# Patient Record
Sex: Female | Born: 1955 | Race: White | Hispanic: No | Marital: Married | Smoking: Former smoker
Health system: Southern US, Community
[De-identification: ages and names within clinical notes are randomized; demographics above are authoritative.]

## PROBLEM LIST (undated history)

## (undated) ENCOUNTER — Ambulatory Visit: Discharge status: Absent without leave | Payer: BC Managed Care – PPO

## (undated) DIAGNOSIS — I10 Essential (primary) hypertension: Secondary | ICD-10-CM

## (undated) DIAGNOSIS — K8020 Calculus of gallbladder without cholecystitis without obstruction: Secondary | ICD-10-CM

## (undated) HISTORY — PX: ABDOMINAL HYSTERECTOMY: SHX81

---

## 1997-08-22 ENCOUNTER — Other Ambulatory Visit: Admission: RE | Admit: 1997-08-22 | Discharge: 1997-08-22 | Payer: Self-pay | Admitting: Obstetrics and Gynecology

## 1997-09-06 ENCOUNTER — Other Ambulatory Visit: Admission: RE | Admit: 1997-09-06 | Discharge: 1997-09-06 | Payer: Self-pay | Admitting: Obstetrics and Gynecology

## 1997-11-05 ENCOUNTER — Ambulatory Visit (HOSPITAL_COMMUNITY): Admission: RE | Admit: 1997-11-05 | Discharge: 1997-11-05 | Payer: Self-pay | Admitting: Obstetrics and Gynecology

## 1997-11-05 ENCOUNTER — Encounter: Payer: Self-pay | Admitting: Obstetrics and Gynecology

## 1997-11-14 ENCOUNTER — Encounter: Admission: RE | Admit: 1997-11-14 | Discharge: 1997-11-14 | Payer: Self-pay | Admitting: *Deleted

## 1998-09-30 ENCOUNTER — Other Ambulatory Visit: Admission: RE | Admit: 1998-09-30 | Discharge: 1998-09-30 | Payer: Self-pay | Admitting: Obstetrics and Gynecology

## 2000-02-12 ENCOUNTER — Ambulatory Visit (HOSPITAL_COMMUNITY): Admission: RE | Admit: 2000-02-12 | Discharge: 2000-02-12 | Payer: Self-pay | Admitting: Obstetrics and Gynecology

## 2000-03-19 ENCOUNTER — Encounter: Payer: Self-pay | Admitting: Obstetrics and Gynecology

## 2000-03-24 ENCOUNTER — Inpatient Hospital Stay (HOSPITAL_COMMUNITY): Admission: RE | Admit: 2000-03-24 | Discharge: 2000-03-25 | Payer: Self-pay | Admitting: Obstetrics and Gynecology

## 2000-05-23 ENCOUNTER — Encounter: Payer: Self-pay | Admitting: Emergency Medicine

## 2000-05-23 ENCOUNTER — Emergency Department (HOSPITAL_COMMUNITY): Admission: EM | Admit: 2000-05-23 | Discharge: 2000-05-23 | Payer: Self-pay | Admitting: Emergency Medicine

## 2000-10-27 ENCOUNTER — Other Ambulatory Visit: Admission: RE | Admit: 2000-10-27 | Discharge: 2000-10-27 | Payer: Self-pay | Admitting: Obstetrics and Gynecology

## 2001-12-06 ENCOUNTER — Other Ambulatory Visit: Admission: RE | Admit: 2001-12-06 | Discharge: 2001-12-06 | Payer: Self-pay | Admitting: Obstetrics and Gynecology

## 2005-01-05 ENCOUNTER — Emergency Department (HOSPITAL_COMMUNITY): Admission: EM | Admit: 2005-01-05 | Discharge: 2005-01-06 | Payer: Self-pay | Admitting: *Deleted
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
## 2010-04-06 ENCOUNTER — Encounter: Payer: Self-pay | Admitting: Family Medicine

## 2010-04-09 ENCOUNTER — Encounter
Admission: RE | Admit: 2010-04-09 | Discharge: 2010-04-09 | Payer: Self-pay | Source: Home / Self Care | Attending: Family Medicine | Admitting: Family Medicine

## 2010-04-15 ENCOUNTER — Encounter
Admission: RE | Admit: 2010-04-15 | Discharge: 2010-04-15 | Payer: Self-pay | Source: Home / Self Care | Attending: Family Medicine | Admitting: Family Medicine

## 2010-06-26 ENCOUNTER — Other Ambulatory Visit: Payer: Self-pay | Admitting: Family Medicine

## 2010-06-26 DIAGNOSIS — R110 Nausea: Secondary | ICD-10-CM

## 2010-06-27 ENCOUNTER — Ambulatory Visit
Admission: RE | Admit: 2010-06-27 | Discharge: 2010-06-27 | Disposition: A | Discharge status: Home / Self Care | Payer: BC Managed Care – PPO | Source: Ambulatory Visit | Attending: Family Medicine | Admitting: Family Medicine

## 2010-06-27 DIAGNOSIS — R110 Nausea: Secondary | ICD-10-CM

## 2010-07-04 ENCOUNTER — Other Ambulatory Visit: Payer: Self-pay | Admitting: Family Medicine

## 2010-07-04 DIAGNOSIS — D1803 Hemangioma of intra-abdominal structures: Secondary | ICD-10-CM

## 2010-07-04 DIAGNOSIS — R1011 Right upper quadrant pain: Secondary | ICD-10-CM

## 2011-01-14 ENCOUNTER — Ambulatory Visit
Admission: RE | Admit: 2011-01-14 | Discharge: 2011-01-14 | Disposition: A | Discharge status: Home / Self Care | Payer: BC Managed Care – PPO | Source: Ambulatory Visit | Attending: Family Medicine | Admitting: Family Medicine

## 2011-01-14 DIAGNOSIS — R1011 Right upper quadrant pain: Secondary | ICD-10-CM

## 2011-01-14 DIAGNOSIS — D1803 Hemangioma of intra-abdominal structures: Secondary | ICD-10-CM

## 2011-04-11 ENCOUNTER — Other Ambulatory Visit: Payer: Self-pay

## 2011-04-11 ENCOUNTER — Emergency Department (HOSPITAL_BASED_OUTPATIENT_CLINIC_OR_DEPARTMENT_OTHER)
Admission: EM | Admit: 2011-04-11 | Discharge: 2011-04-11 | Disposition: A | Discharge status: Home / Self Care | Payer: BC Managed Care – PPO | Attending: Emergency Medicine | Admitting: Emergency Medicine

## 2011-04-11 ENCOUNTER — Encounter (HOSPITAL_BASED_OUTPATIENT_CLINIC_OR_DEPARTMENT_OTHER): Payer: Self-pay | Admitting: *Deleted

## 2011-04-11 DIAGNOSIS — Z79899 Other long term (current) drug therapy: Secondary | ICD-10-CM | POA: Insufficient documentation

## 2011-04-11 DIAGNOSIS — K8050 Calculus of bile duct without cholangitis or cholecystitis without obstruction: Secondary | ICD-10-CM

## 2011-04-11 DIAGNOSIS — R1013 Epigastric pain: Secondary | ICD-10-CM | POA: Insufficient documentation

## 2011-04-11 DIAGNOSIS — I10 Essential (primary) hypertension: Secondary | ICD-10-CM | POA: Insufficient documentation

## 2011-04-11 DIAGNOSIS — R112 Nausea with vomiting, unspecified: Secondary | ICD-10-CM | POA: Insufficient documentation

## 2011-04-11 DIAGNOSIS — K8020 Calculus of gallbladder without cholecystitis without obstruction: Secondary | ICD-10-CM | POA: Insufficient documentation

## 2011-04-11 HISTORY — DX: Essential (primary) hypertension: I10

## 2011-04-11 HISTORY — DX: Calculus of gallbladder without cholecystitis without obstruction: K80.20

## 2011-04-11 LAB — BASIC METABOLIC PANEL
Calcium: 9.9 mg/dL (ref 8.4–10.5)
GFR calc Af Amer: >90 mL/min (ref >90)
GFR calc non Af Amer: 81 mL/min — ABNORMAL LOW (ref >90)
Sodium: 138 mEq/L (ref 135–145)

## 2011-04-11 LAB — DIFFERENTIAL
Eosinophils Absolute: 0 10*3/uL (ref 0.0–0.7)
Lymphs Abs: 1.3 10*3/uL (ref 0.7–4.0)
Neutro Abs: 9.7 10*3/uL — ABNORMAL HIGH (ref 1.7–7.7)
Neutrophils Relative %: 82 % — ABNORMAL HIGH (ref 43–77)

## 2011-04-11 LAB — URINE MICROSCOPIC-ADD ON

## 2011-04-11 LAB — URINALYSIS, ROUTINE W REFLEX MICROSCOPIC
Ketones, ur: 15 mg/dL — AB
Nitrite: NEGATIVE
Protein, ur: NEGATIVE mg/dL
pH: 7 (ref 5.0–8.0)

## 2011-04-11 LAB — CBC
MCH: 32.8 pg (ref 26.0–34.0)
Platelets: 250 10*3/uL (ref 150–400)
RBC: 4.61 MIL/uL (ref 3.87–5.11)
WBC: 11.9 10*3/uL — ABNORMAL HIGH (ref 4.0–10.5)

## 2011-04-11 LAB — HEPATIC FUNCTION PANEL
AST: 19 U/L (ref 0–37)
Bilirubin, Direct: 0.1 mg/dL (ref 0.0–0.3)
Indirect Bilirubin: 0.3 mg/dL (ref 0.3–0.9)

## 2011-04-11 LAB — LIPASE, BLOOD: Lipase: 33 U/L (ref 11–59)

## 2011-04-11 MED ORDER — ONDANSETRON HCL 4 MG/2ML IJ SOLN
4.0000 mg | Freq: Once | INTRAMUSCULAR | Status: AC
  Administered 2011-04-11: 4 mg via INTRAVENOUS
  Filled 2011-04-11: qty 2

## 2011-04-11 MED ORDER — POTASSIUM CHLORIDE 20 MEQ/15ML (10%) PO LIQD
40.0000 meq | Freq: Once | ORAL | Status: AC
  Administered 2011-04-11: 40 meq via ORAL
  Filled 2011-04-11: qty 30

## 2011-04-11 MED ORDER — FENTANYL CITRATE 0.05 MG/ML IJ SOLN
50.0000 ug | Freq: Once | INTRAMUSCULAR | Status: AC
  Administered 2011-04-11: 50 ug via INTRAVENOUS
  Filled 2011-04-11: qty 2

## 2011-04-11 NOTE — ED Provider Notes (Signed)
History   This chart was scribed for Forbes Cellar, MD by Charolett Bumpers . The patient was seen in room MH02/MH02 and the patient's care was started at 9:18pm.  CSN: [id number]  Arrival date & time 04/11/11  1823   First MD Initiated Contact with Patient 04/11/11 2029      Chief Complaint  Patient presents with  . Abdominal Pain    (Consider location/radiation/quality/duration/timing/severity/associated sxs/prior treatment) HPI Pamela Waters is a 56 y.o. female who presents to the Emergency Department complaining of constant, moderate to severe abdominal pain that is focused in the center. Patient reports associated nausea and vomiting. Patient also notes chest pressure when the abdominal pain is at it's worse. Patient reports a h/o a gall bladder attack that occurred April of last year. Patient reports that the pain she is experiencing today is similar. Patient states that the pain a 9/10 and started after eating dinner today. Patient reports no relief with Tramadol. Patient denies fever, chills, diarrhea, constipation, chest pain, SOB, acid reflux, dysuria, hematuria, and blood in stool. Patient states that her abdominal pain is similar to her previous gall bladder attack, and is only relieved by the pain medication given at the ED visit.   Past Medical History  Diagnosis Date  . Gallstones   . Hypertension     Past Surgical History  Procedure Date  . Abdominal hysterectomy     No family history on file.  History  Substance Use Topics  . Smoking status: Former Games developer  . Smokeless tobacco: Never Used  . Alcohol Use: No    OB History    Grav Para Term Preterm Abortions TAB SAB Ect Mult Living                  Review of Systems A complete 10 system review of systems was obtained and is otherwise negative except as noted in the HPI and PMH.   Allergies  Review of patient's allergies indicates no known allergies.  Home Medications   Current Outpatient Rx   Name Route Sig Dispense Refill  . ASPIRIN 81 MG PO TABS Oral Take 81 mg by mouth daily.    . IBUPROFEN 200 MG PO TABS Oral Take 400 mg by mouth every 6 (six) hours as needed. For pain    . LISINOPRIL-HYDROCHLOROTHIAZIDE 20-25 MG PO TABS Oral Take 1 tablet by mouth daily.    Marland Kitchen METOPROLOL TARTRATE 25 MG PO TABS Oral Take 25 mg by mouth 2 (two) times daily.    . ADULT MULTIVITAMIN W/MINERALS CH Oral Take 1 tablet by mouth daily.    Marland Kitchen FISH OIL PO Oral Take 1 capsule by mouth daily.    . TRAMADOL HCL 50 MG PO TABS Oral Take 50 mg by mouth every 6 (six) hours as needed. For pain      BP 146/78  Pulse 56  Temp(Src) 97.6 F (36.4 C) (Oral)  Resp 18  Ht 5\' 9"  (1.753 m)  Wt 230 lb (104.327 kg)  BMI 33.96 kg/m2  SpO2 100%  Physical Exam  Nursing note and vitals reviewed. Constitutional: She is oriented to person, place, and time. She appears well-developed and well-nourished. No distress.  HENT:  Head: Normocephalic and atraumatic.  Mouth/Throat: Oropharynx is clear and moist.  Eyes: EOM are normal. Pupils are equal, round, and reactive to light.  Neck: Normal range of motion. Neck supple. No tracheal deviation present.  Cardiovascular: Normal rate, regular rhythm and normal heart sounds.  Exam reveals no  gallop and no friction rub.   No murmur heard. Pulmonary/Chest: Effort normal and breath sounds normal. No respiratory distress. She has no wheezes. She has no rales. She exhibits no tenderness.  Abdominal: Soft. Bowel sounds are normal. She exhibits no distension and no mass. There is no tenderness. There is no rebound and no guarding.       No RUQ tenderness, No Murphy's sign.   Musculoskeletal: Normal range of motion. She exhibits no edema.  Neurological: She is alert and oriented to person, place, and time. No sensory deficit.  Skin: Skin is warm and dry.  Psychiatric: She has a normal mood and affect. Her behavior is normal.    Date: 04/11/2011  Rate: 67  Rhythm: normal sinus  rhythm with sinus arrythmia  QRS Axis: normal  Intervals: QT prolonged  ST/T Wave abnormalities: nonspecific T wave changes  Conduction Disutrbances:none  Narrative Interpretation:   Old EKG Reviewed: no significant change    ED Course  Procedures (including critical care time)  DIAGNOSTIC STUDIES: Oxygen Saturation is 100% on room air, normal by my interpretation.    COORDINATION OF CARE:   2030: Medication Orders: Ondansetron, Fetanyl 2130: Medication Orders: Potassium Chloride   Labs Reviewed  BASIC METABOLIC PANEL - Abnormal; Notable for the following:    Potassium 3.1 (*)    Glucose, Bld 128 (*)    BUN 25 (*)    GFR calc non Af Amer 81 (*)    All other components within normal limits  CBC - Abnormal; Notable for the following:    WBC 11.9 (*)    Hemoglobin 15.1 (*)    MCHC 36.8 (*)    All other components within normal limits  DIFFERENTIAL - Abnormal; Notable for the following:    Neutrophils Relative 82 (*)    Neutro Abs 9.7 (*)    Lymphocytes Relative 11 (*)    All other components within normal limits  URINALYSIS, ROUTINE W REFLEX MICROSCOPIC - Abnormal; Notable for the following:    Ketones, ur 15 (*)    Leukocytes, UA SMALL (*)    All other components within normal limits  URINE MICROSCOPIC-ADD ON - Abnormal; Notable for the following:    Squamous Epithelial / LPF FEW (*)    Bacteria, UA MANY (*)    All other components within normal limits  LIPASE, BLOOD  HEPATIC FUNCTION PANEL   No results found.   1. Biliary colic   2. Abdominal pain   3. Cholelithiasis       MDM  RUQ/epigastric pain-- resolved. Bedside U/S -INFORMAL- with cholelithiasis- No pericholecystic fluid or GB wall thickening. Negative sonographic Murphy's. Abd pain resolved and abd benign. LFTs wnl. Contaminated urine sample. Patient comfortable with plan for discharge home and f/u with PMD and general surgery. Given strict precautions for return to the Emergency Department.   I  personally performed the services described in this documentation, which was scribed in my presence. The recorded information has been reviewed and considered.        Forbes Cellar, MD 04/13/11 [phone]

## 2011-04-11 NOTE — ED Notes (Signed)
Pt reports upper abd pain since 1530- hx of gallstones

## 2011-07-27 ENCOUNTER — Emergency Department (HOSPITAL_BASED_OUTPATIENT_CLINIC_OR_DEPARTMENT_OTHER)
Admission: EM | Admit: 2011-07-27 | Discharge: 2011-07-27 | Disposition: A | Discharge status: Home / Self Care | Payer: BC Managed Care – PPO | Attending: Emergency Medicine | Admitting: Emergency Medicine

## 2011-07-27 ENCOUNTER — Encounter (HOSPITAL_BASED_OUTPATIENT_CLINIC_OR_DEPARTMENT_OTHER): Payer: Self-pay | Admitting: *Deleted

## 2011-07-27 DIAGNOSIS — R110 Nausea: Secondary | ICD-10-CM | POA: Insufficient documentation

## 2011-07-27 DIAGNOSIS — Z87891 Personal history of nicotine dependence: Secondary | ICD-10-CM | POA: Insufficient documentation

## 2011-07-27 DIAGNOSIS — I10 Essential (primary) hypertension: Secondary | ICD-10-CM | POA: Insufficient documentation

## 2011-07-27 DIAGNOSIS — E876 Hypokalemia: Secondary | ICD-10-CM | POA: Insufficient documentation

## 2011-07-27 DIAGNOSIS — K8020 Calculus of gallbladder without cholecystitis without obstruction: Secondary | ICD-10-CM | POA: Insufficient documentation

## 2011-07-27 DIAGNOSIS — K8050 Calculus of bile duct without cholangitis or cholecystitis without obstruction: Secondary | ICD-10-CM

## 2011-07-27 LAB — CBC
HCT: 39.9 % (ref 36.0–46.0)
MCV: 90.5 fL (ref 78.0–100.0)
RBC: 4.41 MIL/uL (ref 3.87–5.11)
WBC: 8.9 10*3/uL (ref 4.0–10.5)

## 2011-07-27 LAB — DIFFERENTIAL
Eosinophils Relative: 1 % (ref 0–5)
Lymphocytes Relative: 34 % (ref 12–46)
Lymphs Abs: 3 10*3/uL (ref 0.7–4.0)
Monocytes Absolute: 0.8 10*3/uL (ref 0.1–1.0)

## 2011-07-27 LAB — COMPREHENSIVE METABOLIC PANEL
Albumin: 3.8 g/dL (ref 3.5–5.2)
BUN: 23 mg/dL (ref 6–23)
Calcium: 9.8 mg/dL (ref 8.4–10.5)
Creatinine, Ser: 1 mg/dL (ref 0.50–1.10)
Total Protein: 7.1 g/dL (ref 6.0–8.3)

## 2011-07-27 LAB — LIPASE, BLOOD: Lipase: 58 U/L (ref 11–59)

## 2011-07-27 MED ORDER — FENTANYL CITRATE 0.05 MG/ML IJ SOLN
50.0000 ug | Freq: Once | INTRAMUSCULAR | Status: AC
  Administered 2011-07-27: 50 ug via INTRAVENOUS

## 2011-07-27 MED ORDER — POTASSIUM CHLORIDE CRYS ER 20 MEQ PO TBCR: 20.0000 meq | EXTENDED_RELEASE_TABLET | Freq: Two times a day (BID) | ORAL | Status: DC

## 2011-07-27 MED ORDER — POTASSIUM CHLORIDE 20 MEQ/15ML (10%) PO LIQD
20.0000 meq | Freq: Once | ORAL | Status: AC
  Administered 2011-07-27: 20 meq via ORAL
  Filled 2011-07-27: qty 15

## 2011-07-27 MED ORDER — PROMETHAZINE HCL 25 MG/ML IJ SOLN
INTRAMUSCULAR | Status: AC
  Administered 2011-07-27: 25 mg
  Filled 2011-07-27: qty 1

## 2011-07-27 MED ORDER — POTASSIUM CHLORIDE 10 MEQ/100ML IV SOLN
INTRAVENOUS | Status: AC
  Filled 2011-07-27: qty 100

## 2011-07-27 MED ORDER — POTASSIUM CHLORIDE 10 MEQ/100ML IV SOLN
10.0000 meq | Freq: Once | INTRAVENOUS | Status: AC
  Administered 2011-07-27: 10 meq via INTRAVENOUS

## 2011-07-27 MED ORDER — PROMETHAZINE HCL 25 MG/ML IJ SOLN
INTRAMUSCULAR | Status: AC
  Administered 2011-07-27: 12.5 mg via INTRAVENOUS
  Filled 2011-07-27: qty 1

## 2011-07-27 MED ORDER — FENTANYL CITRATE 0.05 MG/ML IJ SOLN
INTRAMUSCULAR | Status: AC
  Filled 2011-07-27: qty 2

## 2011-07-27 MED ORDER — PROMETHAZINE HCL 25 MG PO TABS: 25.0000 mg | ORAL_TABLET | Freq: Four times a day (QID) | ORAL | Status: DC | PRN

## 2011-07-27 MED ORDER — HYDROMORPHONE HCL PF 1 MG/ML IJ SOLN
1.0000 mg | Freq: Once | INTRAMUSCULAR | Status: AC
  Administered 2011-07-27: 1 mg via INTRAVENOUS
  Filled 2011-07-27: qty 1

## 2011-07-27 MED ORDER — PROMETHAZINE HCL 25 MG/ML IJ SOLN
12.5000 mg | Freq: Once | INTRAMUSCULAR | Status: AC
  Administered 2011-07-27: 12.5 mg via INTRAVENOUS

## 2011-07-27 MED ORDER — HYDROCODONE-ACETAMINOPHEN 5-325 MG PO TABS: 1.0000 | ORAL_TABLET | Freq: Four times a day (QID) | ORAL | Status: AC | PRN

## 2011-07-27 MED ORDER — ONDANSETRON HCL 4 MG/2ML IJ SOLN
4.0000 mg | Freq: Once | INTRAMUSCULAR | Status: AC
  Administered 2011-07-27: 4 mg via INTRAVENOUS
  Filled 2011-07-27: qty 2

## 2011-07-27 NOTE — ED Provider Notes (Signed)
History     CSN: [id number]  Arrival date & time 07/27/11  0108   First MD Initiated Contact with Patient 07/27/11 0201      Chief Complaint  Patient presents with  . Abdominal Pain    (Consider location/radiation/quality/duration/timing/severity/associated sxs/prior treatment) HPI This 56 year old white female with a history of gallstones. She has had episodes of biliary colic occasionally for about the last 2 years. She is here with a right upper quadrant pain that began about 2 hours ago. It radiates to her back and her left shoulder. It is severe and worse with movement or palpation. She's been nauseated with it but has not vomited. She's not having diarrhea or fever. The pain is characterized as similar to previous attacks of biliary colic.  Past Medical History  Diagnosis Date  . Gallstones   . Hypertension     Past Surgical History  Procedure Date  . Abdominal hysterectomy     History reviewed. No pertinent family history.  History  Substance Use Topics  . Smoking status: Former Games developer  . Smokeless tobacco: Never Used  . Alcohol Use: No    OB History    Grav Para Term Preterm Abortions TAB SAB Ect Mult Living                  Review of Systems  All other systems reviewed and are negative.    Allergies  Review of patient's allergies indicates no known allergies.  Home Medications   Current Outpatient Rx  Name Route Sig Dispense Refill  . ASPIRIN 81 MG PO TABS Oral Take 81 mg by mouth daily.    . IBUPROFEN 200 MG PO TABS Oral Take 400 mg by mouth every 6 (six) hours as needed. For pain    . LISINOPRIL-HYDROCHLOROTHIAZIDE 20-25 MG PO TABS Oral Take 1 tablet by mouth daily.    Marland Kitchen METOPROLOL TARTRATE 25 MG PO TABS Oral Take 25 mg by mouth 2 (two) times daily.    . ADULT MULTIVITAMIN W/MINERALS CH Oral Take 1 tablet by mouth daily.    Marland Kitchen FISH OIL PO Oral Take 1 capsule by mouth daily.    . TRAMADOL HCL 50 MG PO TABS Oral Take 50 mg by mouth every 6 (six)  hours as needed. For pain      BP 148/81  Pulse 50  Temp(Src) 97.7 F (36.5 C) (Oral)  Resp 14  SpO2 100%  Physical Exam General: Well-developed, well-nourished female in no acute distress; appearance consistent with age of record; appears uncomfortable HENT: normocephalic, atraumatic Eyes: pupils equal round and reactive to light; extraocular muscles intact Neck: supple Heart: regular rate and rhythm Lungs: clear to auscultation bilaterally Abdomen: soft; nondistended; epigastric and right upper quadrant tenderness; bowel sounds present Extremities: No deformity; full range of motion Neurologic: Awake, alert; motor function intact in all extremities and symmetric; no facial droop Skin: Warm and dry Psychiatric: Anxious    ED Course  Procedures (including critical care time)     MDM   Nursing notes and vitals signs, including pulse oximetry, reviewed.  Summary of this visit's results, reviewed by myself:  Labs:  Results for orders placed during the hospital encounter of 07/27/11  COMPREHENSIVE METABOLIC PANEL      Component Value Range   Sodium 139  135 - 145 (mEq/L)   Potassium 2.9 (*) 3.5 - 5.1 (mEq/L)   Chloride 100  96 - 112 (mEq/L)   CO2 28  19 - 32 (mEq/L)   Glucose, Bld  137 (*) 70 - 99 (mg/dL)   BUN 23  6 - 23 (mg/dL)   Creatinine, Ser 1.61  0.50 - 1.10 (mg/dL)   Calcium 9.8  8.4 - 09.6 (mg/dL)   Total Protein 7.1  6.0 - 8.3 (g/dL)   Albumin 3.8  3.5 - 5.2 (g/dL)   AST 15  0 - 37 (U/L)   ALT 14  0 - 35 (U/L)   Alkaline Phosphatase 95  39 - 117 (U/L)   Total Bilirubin 0.3  0.3 - 1.2 (mg/dL)   GFR calc non Af Amer 62 (*) >90 (mL/min)   GFR calc Af Amer 72 (*) >90 (mL/min)  CBC      Component Value Range   WBC 8.9  4.0 - 10.5 (K/uL)   RBC 4.41  3.87 - 5.11 (MIL/uL)   Hemoglobin 13.8  12.0 - 15.0 (g/dL)   HCT 04.5  40.9 - 81.1 (%)   MCV 90.5  78.0 - 100.0 (fL)   MCH 31.3  26.0 - 34.0 (pg)   MCHC 34.6  30.0 - 36.0 (g/dL)   RDW 91.4  78.2 - 95.6 (%)     Platelets 248  150 - 400 (K/uL)  DIFFERENTIAL      Component Value Range   Neutrophils Relative 56  43 - 77 (%)   Neutro Abs 5.0  1.7 - 7.7 (K/uL)   Lymphocytes Relative 34  12 - 46 (%)   Lymphs Abs 3.0  0.7 - 4.0 (K/uL)   Monocytes Relative 9  3 - 12 (%)   Monocytes Absolute 0.8  0.1 - 1.0 (K/uL)   Eosinophils Relative 1  0 - 5 (%)   Eosinophils Absolute 0.1  0.0 - 0.7 (K/uL)   Basophils Relative 0  0 - 1 (%)   Basophils Absolute 0.0  0.0 - 0.1 (K/uL)  LIPASE, BLOOD      Component Value Range   Lipase 58  11 - 59 (U/L)   6:33 AM Pain significantly improved after IV medications. Patient did become nauseated after given Dilaudid. Nausea is improved with IV Phenergan. Will refer to surgery for elective cholecystectomy.         Hanley Seamen, MD 07/27/11 [phone]

## 2011-07-27 NOTE — Discharge Instructions (Signed)
Biliary Colic  Biliary colic is a steady or irregular pain in the upper abdomen. It is usually under the right side of the rib cage. It happens when gallstones interfere with the normal flow of bile from the gallbladder. Bile is a liquid that helps to digest fats. Bile is made in the liver and stored in the gallbladder. When you eat a meal, bile passes from the gallbladder through the cystic duct and the common bile duct into the small intestine. There, it mixes with partially digested food. If a gallstone blocks either of these ducts, the normal flow of bile is blocked. The muscle cells in the bile duct contract forcefully to try to move the stone. This causes the pain of biliary colic.  SYMPTOMS   A person with biliary colic usually complains of pain in the upper abdomen. This pain can be:   In the center of the upper abdomen just below the breastbone.   In the upper-right part of the abdomen, near the gallbladder and liver.   Spread back toward the right shoulder blade.   Nausea and vomiting.   The pain usually occurs after eating.   Biliary colic is usually triggered by the digestive system's demand for bile. The demand for bile is high after fatty meals. Symptoms can also occur when a person who has been fasting suddenly eats a very large meal. Most episodes of biliary colic pass after 1 to 5 hours. After the most intense pain passes, your abdomen may continue to ache mildly for about 24 hours.  DIAGNOSIS  After you describe your symptoms, your caregiver will perform a physical exam. He or she will pay attention to the upper right portion of your belly (abdomen). This is the area of your liver and gallbladder. An ultrasound will help your caregiver look for gallstones. Specialized scans of the gallbladder may also be done. Blood tests may be done, especially if you have fever or if your pain persists. PREVENTION  Biliary colic can be prevented by controlling the risk factors for gallstones.  Some of these risk factors, such as heredity, increasing age, and pregnancy are a normal part of life. Obesity and a high-fat diet are risk factors you can change through a healthy lifestyle. Women going through menopause who take hormone replacement therapy (estrogen) are also more likely to develop biliary colic. TREATMENT   Pain medication may be prescribed.   You may be encouraged to eat a fat-free diet.   If the first episode of biliary colic is severe, or episodes of colic keep retuning, surgery to remove the gallbladder (cholecystectomy) is usually recommended. This procedure can be done through small incisions using an instrument called a laparoscope. The procedure often requires a brief stay in the hospital. Some people can leave the hospital the same day. It is the most widely used treatment in people troubled by painful gallstones. It is effective and safe, with no complications in more than 90% of cases.   If surgery cannot be done, medication that dissolves gallstones may be used. This medication is expensive and can take months or years to work. Only small stones will dissolve.   Rarely, medication to dissolve gallstones is combined with a procedure called shock-wave lithotripsy. This procedure uses carefully aimed shock waves to break up gallstones. In many people treated with this procedure, gallstones form again within a few years.  PROGNOSIS  If gallstones block your cystic duct or common bile duct, you are at risk for repeated episodes of   duct, you are at risk for repeated episodes of biliary colic. There is also a 25% chance that you will develop a gallbladder infection(acute cholecystitis), or some other complication of gallstones within 10 to 20 years. If you have surgery, schedule it at a time that is convenient for you and at a time when you are not sick.  HOME CARE INSTRUCTIONS    Drink plenty of clear fluids.   Avoid fatty, greasy or fried foods, or any foods that make your pain worse.   Take medications as directed.  SEEK MEDICAL  CARE IF:    You develop a fever over 100.5 F (38.1 C).   Your pain gets worse over time.   You develop nausea that prevents you from eating and drinking.   You develop vomiting.  SEEK IMMEDIATE MEDICAL CARE IF:    You have continuous or severe belly (abdominal) pain which is not relieved with medications.   You develop nausea and vomiting which is not relieved with medications.   You have symptoms of biliary colic and you suddenly develop a fever and shaking chills. This may signal cholecystitis. Call your caregiver immediately.   You develop a yellow color to your skin or the white part of your eyes (jaundice).  Document Released: 08/03/2005 Document Revised: 02/19/2011 Document Reviewed: 10/13/2007  [employer] Patient Information 2012 [employer]

## 2011-07-27 NOTE — ED Notes (Signed)
Pt began having upper abd pain that radiates to back as well as nausea pt has been seen x 2 for similar episodes and was given an ultrasound which confirmed gall stones.

## 2011-07-27 NOTE — ED Notes (Signed)
Pt briefly dropped her O2 sats following administration of dilaudid pt placed on 2 L02 and sats returned to normal EDP notified

## 2011-07-27 NOTE — ED Notes (Signed)
Pt only received 12.5 mg of phenergan

## 2011-08-17 ENCOUNTER — Ambulatory Visit (INDEPENDENT_AMBULATORY_CARE_PROVIDER_SITE_OTHER): Payer: BC Managed Care – PPO | Admitting: General Surgery

## 2011-09-25 ENCOUNTER — Encounter (INDEPENDENT_AMBULATORY_CARE_PROVIDER_SITE_OTHER): Payer: Self-pay | Admitting: General Surgery

## 2011-09-25 ENCOUNTER — Ambulatory Visit (INDEPENDENT_AMBULATORY_CARE_PROVIDER_SITE_OTHER): Payer: BC Managed Care – PPO | Admitting: General Surgery

## 2011-09-25 VITALS — BP 142/100 | HR 74 | Temp 98.3°F | Ht 69.0 in | Wt 220.2 lb

## 2011-09-25 DIAGNOSIS — K8020 Calculus of gallbladder without cholecystitis without obstruction: Secondary | ICD-10-CM

## 2011-09-25 NOTE — Progress Notes (Signed)
Subjective:   Abdominal pain, gallstones  Patient ID: Pamela Waters, female   DOB: 1956/01/19, 56 y.o.   MRN: [id number]  HPI Patient presents after a recent emergency visit for acute abdominal pain. The patient had an initial episode of epigastric pain approximately one year ago and was diagnosed with gallstones at that time. Surgery was not initially recommended. She had another acute episode of pain in April of this year and then again more recently that was more severe and she presented to the emergency room. She describes fairly severe pressure-like epigastric pain radiating to both costal margins and up into her chest. This was associated with nausea and vomiting. The symptoms improved after medication in the emergency department. She denies chronic GI complaints such as nausea or pain. She does have somewhat excessive belching. She is here to consider surgical treatment.  Past Medical History  Diagnosis Date  . Gallstones   . Hypertension    Past Surgical History  Procedure Date  . Abdominal hysterectomy    Current Outpatient Prescriptions  Medication Sig Dispense Refill  . aspirin 81 MG tablet Take 81 mg by mouth daily.      Thomasene Mohair [place] 450 MG CAPS Take 450 mg by mouth daily.      Marland Kitchen ibuprofen (ADVIL,MOTRIN) 200 MG tablet Take 400 mg by mouth every 6 (six) hours as needed. For pain      . lisinopril-hydrochlorothiazide (PRINZIDE,ZESTORETIC) 20-25 MG per tablet Take 1 tablet by mouth daily.      . metoprolol tartrate (LOPRESSOR) 25 MG tablet Take 25 mg by mouth 2 (two) times daily.      . Multiple Vitamin (MULITIVITAMIN WITH MINERALS) TABS Take 1 tablet by mouth daily.      . Omega-3 Fatty Acids (FISH OIL PO) Take 1 capsule by mouth daily.      Marland Kitchen RASPBERRY PO Take 375 mg by mouth daily.      . promethazine (PHENERGAN) 25 MG tablet Take 1 tablet (25 mg total) by mouth every 6 (six) hours as needed for nausea.  30 tablet  0   No Known Allergies  Review of Systems       Objective:   Physical Exam General: Alert, Mildly obese Caucasian femal, in no distress Skin: Warm and dry without rash or infection. There is a 2 cm pedunculated skin polyp on her upper right buttock. HEENT: No palpable masses or thyromegaly. Sclera nonicteric. Pupils equal round and reactive. Oropharynx clear. Lymph nodes: No cervical, supraclavicular, or inguinal nodes palpable. Lungs: Breath sounds clear and equal without increased work of breathing Cardiovascular: Regular rate and rhythm without murmur. No JVD or edema. Peripheral pulses intact. Abdomen: Nondistended. Soft and nontender. No masses palpable. No organomegaly. No palpable hernias. Extremities: No edema or joint swelling or deformity. No chronic venous stasis changes. Neurologic: Alert and fully oriented. Gait normal.     Assessment:     Symptomatic cholelithiasis with more frequent and worsening episodes of biliary colic. She also has a enlarging irritated skin polyp on her upper buttock. I have recommended proceeding with laparoscopic cholecystectomy.I discussed the procedure in detail.  The patient was given Agricultural engineer.  We discussed the risks and benefits of a laparoscopic cholecystectomy and possible cholangiogram including, but not limited to bleeding, infection, injury to surrounding structures such as the intestine or liver, bile leak, retained gallstones, need to convert to an open procedure, prolonged diarrhea, blood clots such as  DVT, common bile duct injury, anesthesia risks, and possible need  for additional procedures.  The likelihood of improvement in symptoms and return to the patient's normal status is good. We discussed the typical post-operative recovery course. We will plan to remove her skin polyp at the same setting.    Plan:     Laparoscopic cholecystectomy with cholangiogram and removal of a skin polyp as an outpatient.

## 2011-11-18 ENCOUNTER — Other Ambulatory Visit (INDEPENDENT_AMBULATORY_CARE_PROVIDER_SITE_OTHER): Payer: Self-pay | Admitting: General Surgery

## 2011-11-18 DIAGNOSIS — Q828 Other specified congenital malformations of skin: Secondary | ICD-10-CM

## 2011-11-18 DIAGNOSIS — K8010 Calculus of gallbladder with chronic cholecystitis without obstruction: Secondary | ICD-10-CM

## 2011-12-04 ENCOUNTER — Ambulatory Visit (INDEPENDENT_AMBULATORY_CARE_PROVIDER_SITE_OTHER): Payer: BC Managed Care – PPO | Admitting: General Surgery

## 2011-12-04 ENCOUNTER — Encounter (INDEPENDENT_AMBULATORY_CARE_PROVIDER_SITE_OTHER): Payer: Self-pay | Admitting: General Surgery

## 2011-12-04 VITALS — BP 152/90 | HR 72 | Temp 98.0°F | Resp 16 | Ht 69.0 in | Wt 227.4 lb

## 2011-12-04 DIAGNOSIS — Z09 Encounter for follow-up examination after completed treatment for conditions other than malignant neoplasm: Secondary | ICD-10-CM

## 2011-12-04 NOTE — Progress Notes (Signed)
History: The patient returns approximately 2 weeks following elective laparoscopic cholecystectomy with removal of a large skin tag from her hip with history of repeated biliary colic. She has done well since the surgery without concerns. For soreness is about gone. She's had no pain similar to what she had preoperatively.  Exam: BP 152/90  Pulse 72  Temp 98 F (36.7 C) (Temporal)  Resp 16  Ht 5\' 9"  (1.753 m)  Wt 227 lb 6.4 oz (103.148 kg)  BMI 33.58 kg/m2 Abdomen: Soft and nontender. Wounds are well healed Extremities: The skin polyp excision site is healed  Assessment and plan: Doing well without postoperative complication. She's return to work next week and return here as needed

## 2011-12-10 ENCOUNTER — Other Ambulatory Visit: Payer: Self-pay | Admitting: Family Medicine

## 2011-12-10 DIAGNOSIS — I10 Essential (primary) hypertension: Secondary | ICD-10-CM

## 2012-01-19 ENCOUNTER — Other Ambulatory Visit: Payer: BC Managed Care – PPO

## 2012-03-18 ENCOUNTER — Ambulatory Visit
Admission: RE | Admit: 2012-03-18 | Discharge: 2012-03-18 | Disposition: A | Discharge status: Home / Self Care | Payer: BC Managed Care – PPO | Source: Ambulatory Visit | Attending: Family Medicine | Admitting: Family Medicine

## 2012-03-18 DIAGNOSIS — I10 Essential (primary) hypertension: Secondary | ICD-10-CM

## 2012-09-21 IMAGING — US US ABDOMEN COMPLETE
1 series · 13 of 25 positions shown · non-contrast
Comparison: 06/27/2010.

CLINICAL DATA: Right upper quadrant pain.  Hemangioma.  High blood
pressure.

COMPLETE ABDOMINAL ULTRASOUND

[Series 1: us abdomen complete · 0.28mm/px · 13 of 61 slices shown]
[im 1/61]
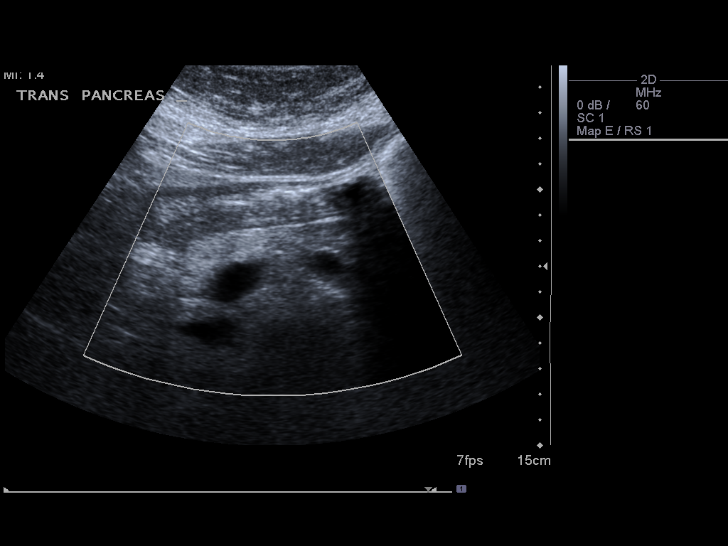
[im 6/61]
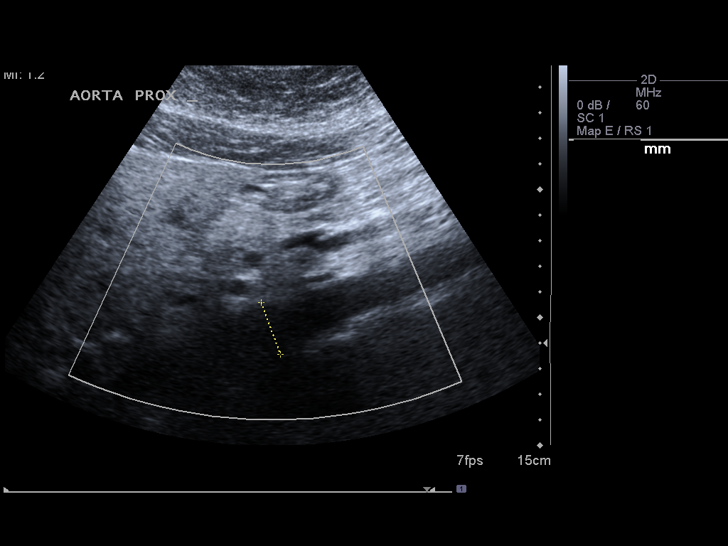
[im 11/61]
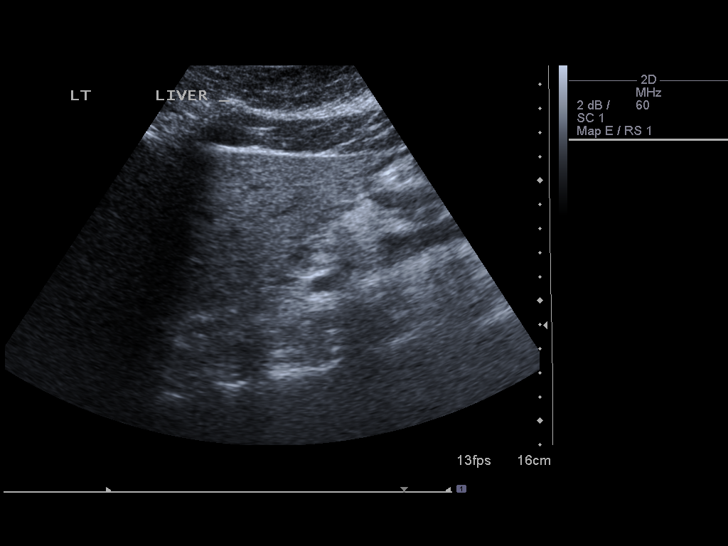
[im 16/61]
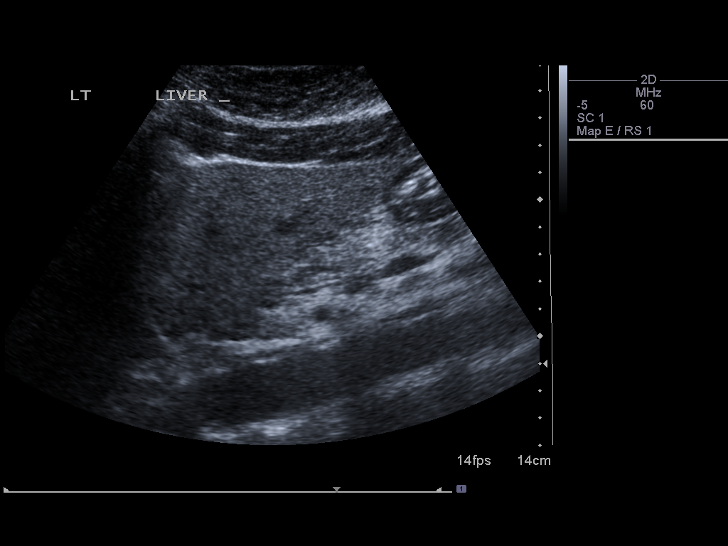
[im 21/61]
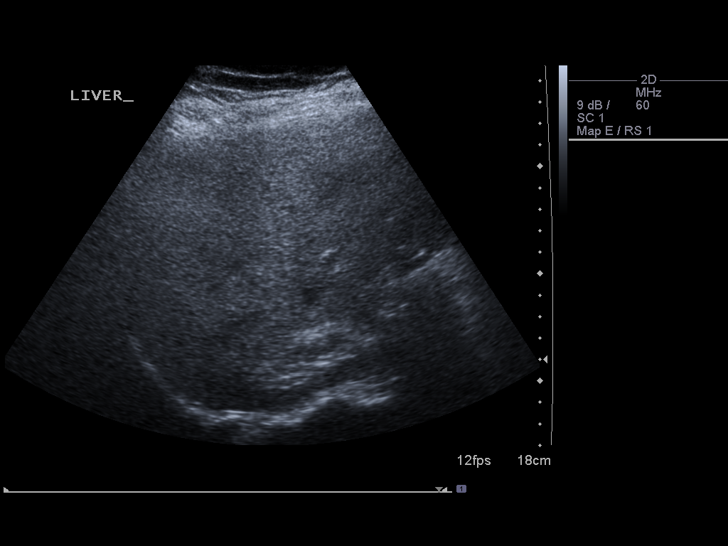
[im 26/61]
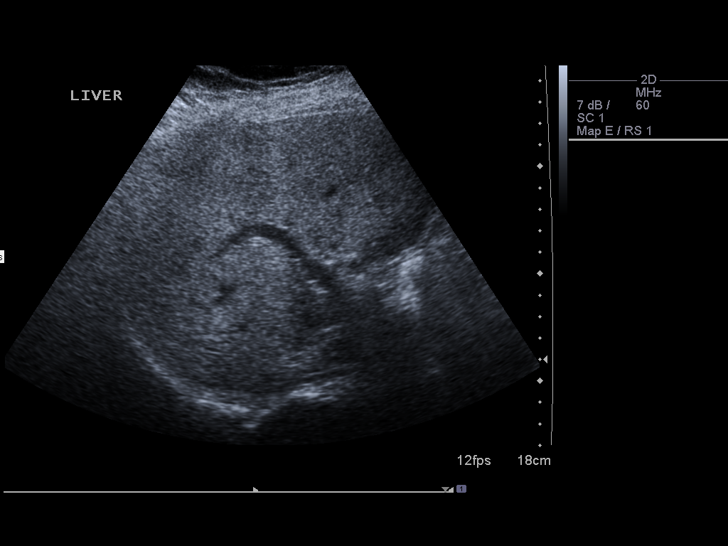
[im 31/61]
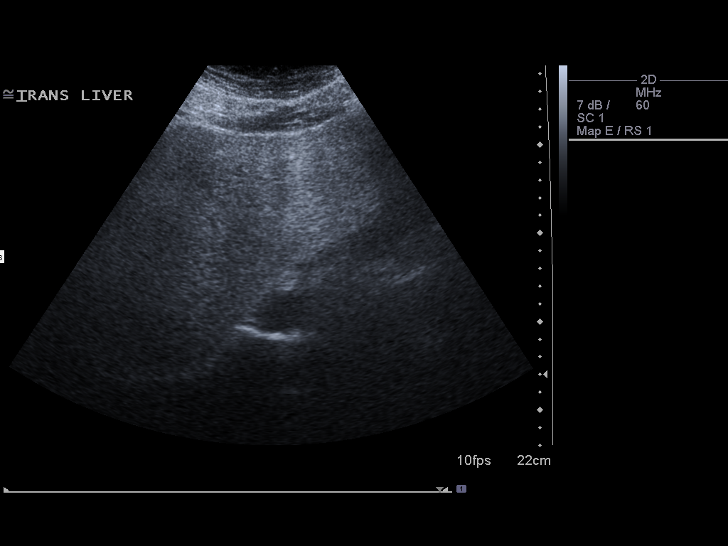
[im 36/61]
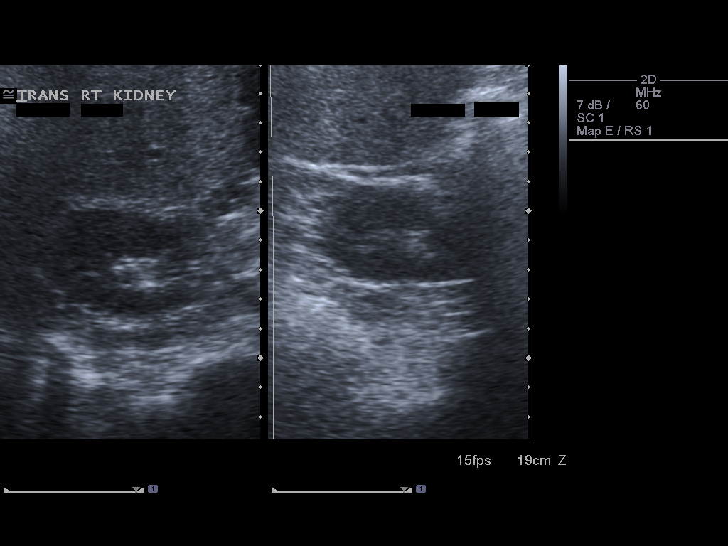
[im 41/61]
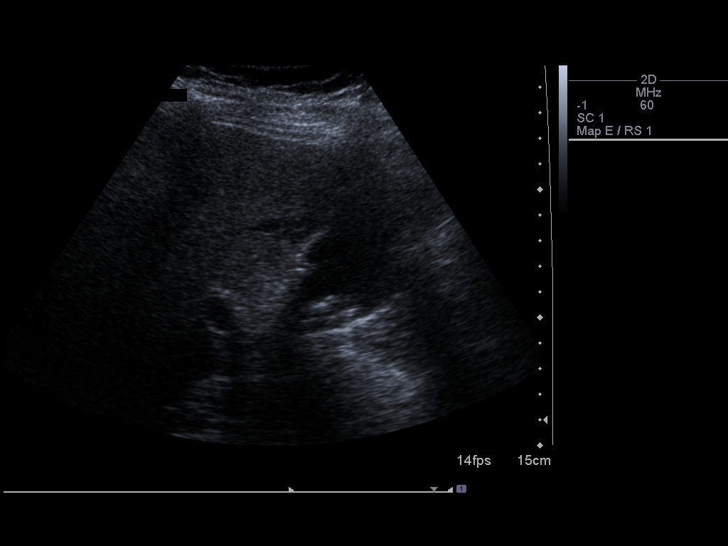
[im 46/61]
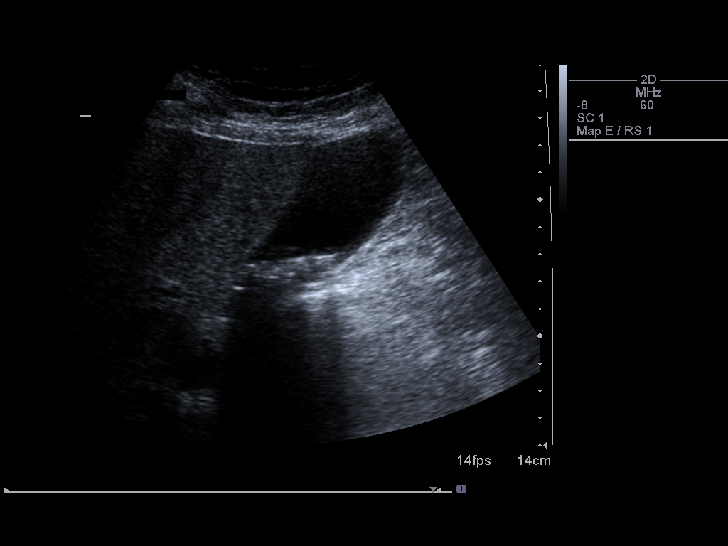
[im 51/61]
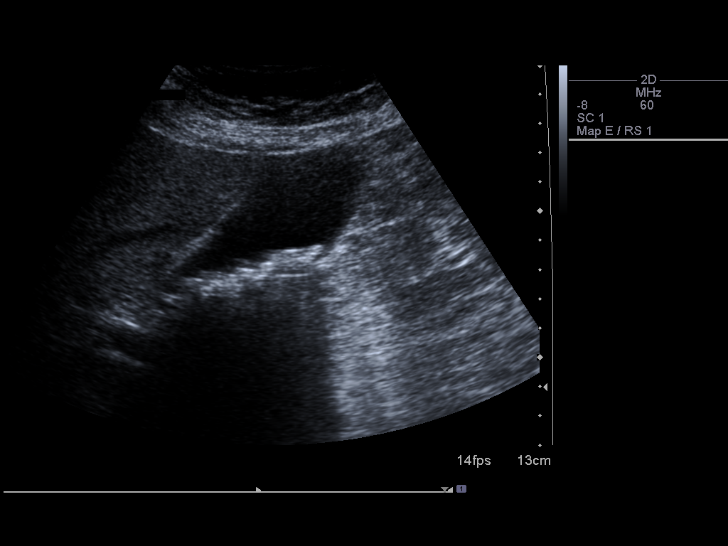
[im 56/61]
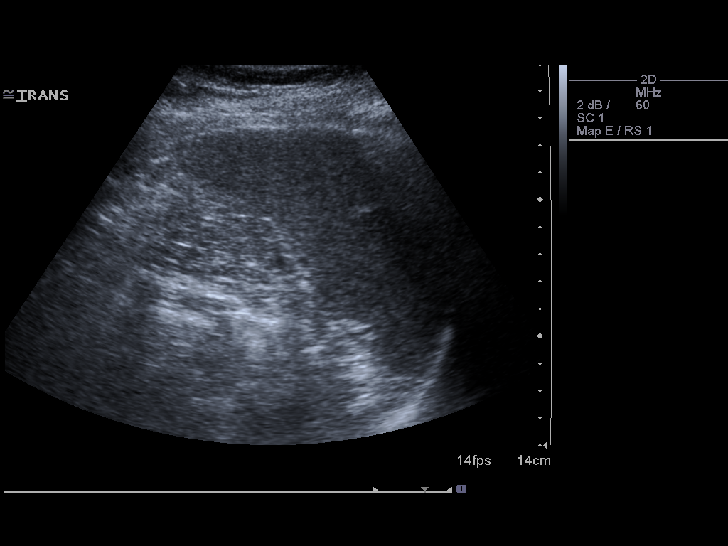
[im 61/61]
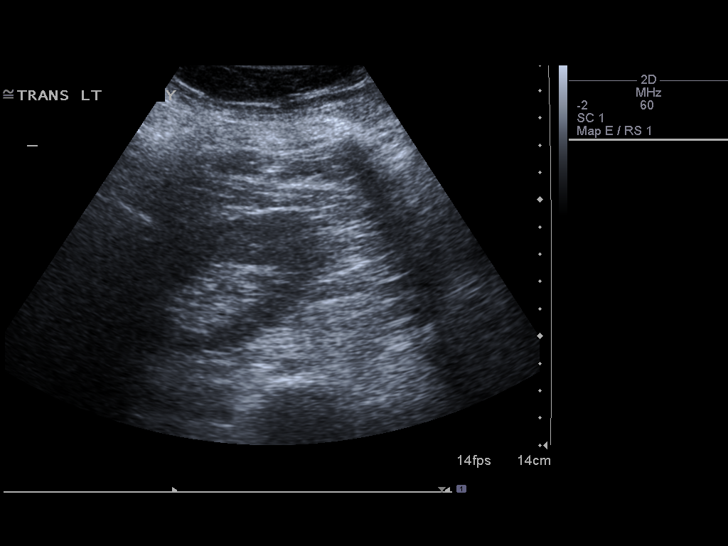

[13 of 25 positions shown; findings below may reference images not displayed]

FINDINGS: Gallbladder:  Multiple mobile gallstones.  No gallbladder wall
thickening or pericholecystic fluid.  The patient was not tender
over this region during scanning.

Common bile duct:  4.7 mm.

Liver:  Diffuse increased echogenicity suggestive of fatty
infiltration.  Elongation of the liver.  Within the left lobe of
the liver is a 1 x 0.7 x 0.8 cm hypoechoic rounded structure.  This
appears relatively similar to the prior exam when this measured 1 x
0.7 x 0.5 cm.  The slight difference in measurements may be related
to minimal differences in technique rather than true growth.  One
could confirm stability on follow-up ultrasound in 6-12 months
(sooner if the patient has a history of primary malignancy).

IVC:  Appears normal.

Pancreas:  No focal abnormality seen.

Spleen:  7.4 cm.  No focal mass.

Right Kidney:  9.7 cm. No hydronephrosis or renal mass.

Left Kidney:  13 cm. No hydronephrosis or renal mass.

Abdominal aorta:  Bifurcation not visualized secondary to overlying
bowel gas.  Maximal AP dimension of the aorta is 2.2 cm.
IMPRESSION: Gallstones once again noted.

Diffuse increased echogenicity suggestive of fatty infiltration.
Elongation of the liver.  Within the left lobe of the liver is a 1
x 0.7 x 0.8 cm hypoechoic rounded structure.  This appears
relatively similar to the prior exam when this measured 1 x 0.7 x
0.5 cm.  The slight difference in measurements may be related to
minimal differences in technique rather than true growth.  One
could confirm stability on follow-up ultrasound in 6-12 months
(sooner if the patient has a history of primary malignancy).

## 2013-11-24 IMAGING — US US ABDOMEN LIMITED
1 series · 14 of 25 positions shown · non-contrast
Comparison: Ultrasound of the abdomen of 01/14/2011 and fourth [id number]
[id number]

CLINICAL DATA: Follow up of liver lesion

LIMITED ABDOMINAL ULTRASOUND - RIGHT UPPER QUADRANT

[Series 1: us abdomen limited · 0.30mm/px · 14 of 36 slices shown]
[im 1/36]
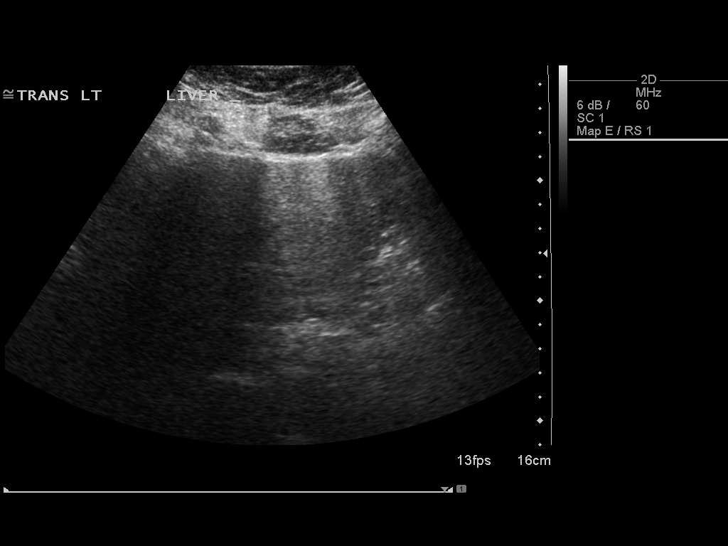
[im 3/36]
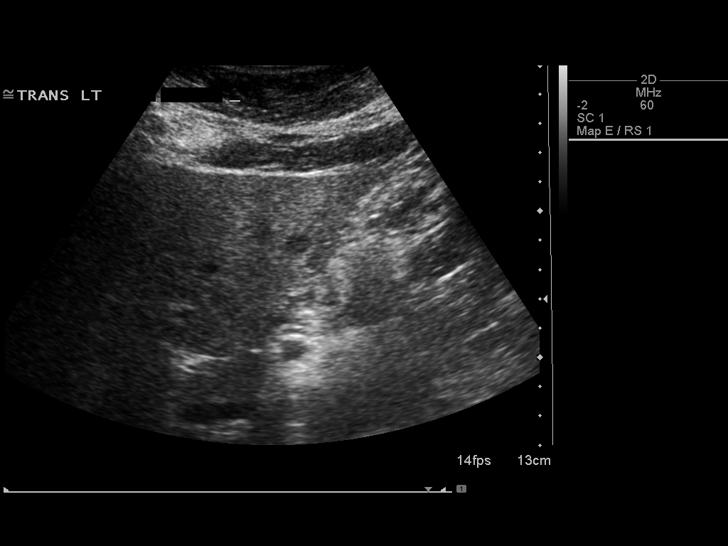
[im 6/36]
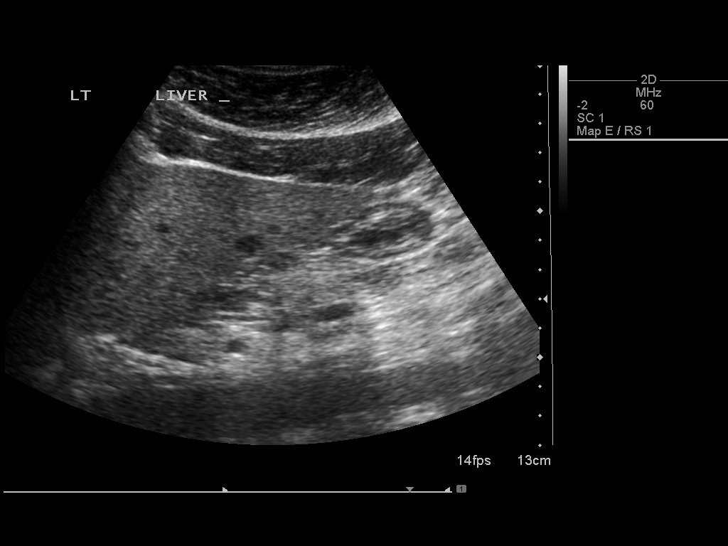
[im 9/36]
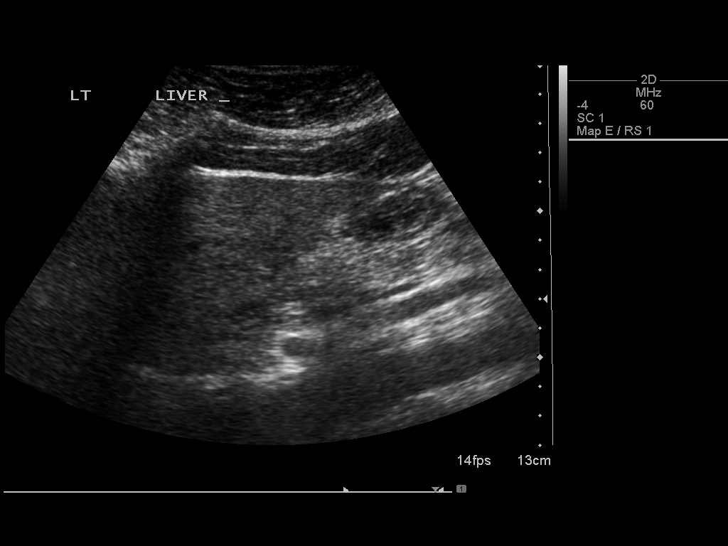
[im 12/36]
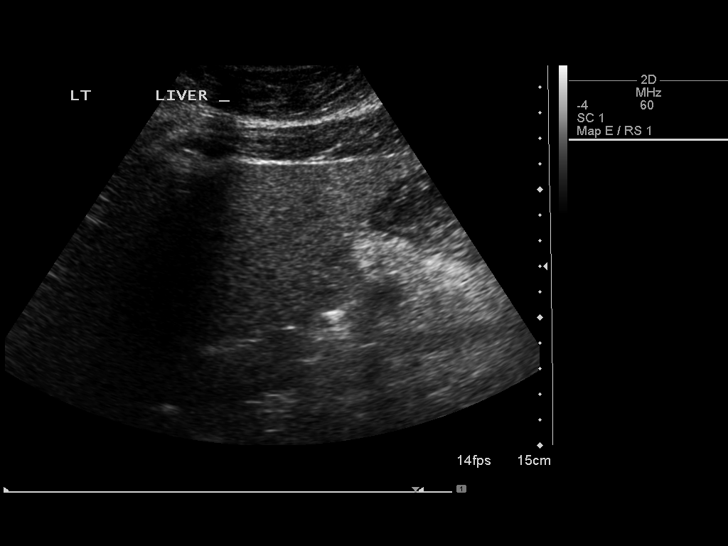
[im 14/36]
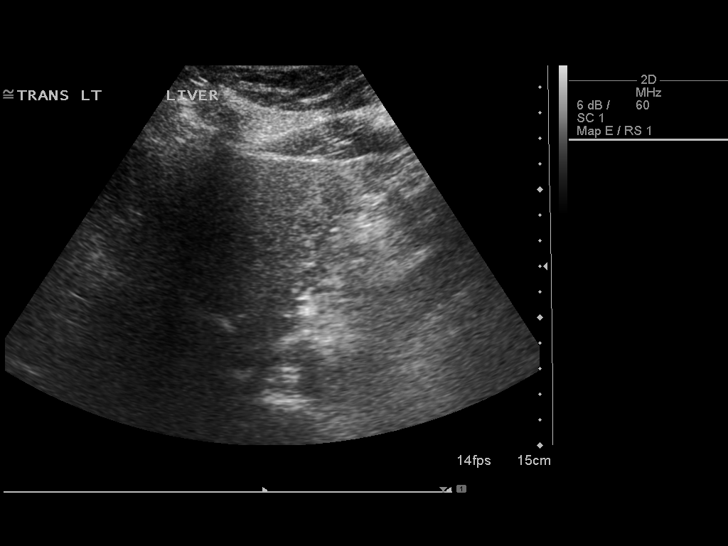
[im 17/36]
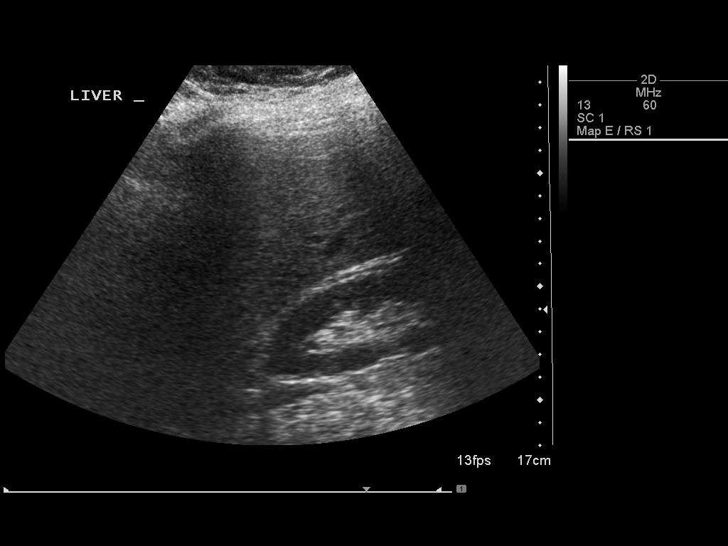
[im 19/36]
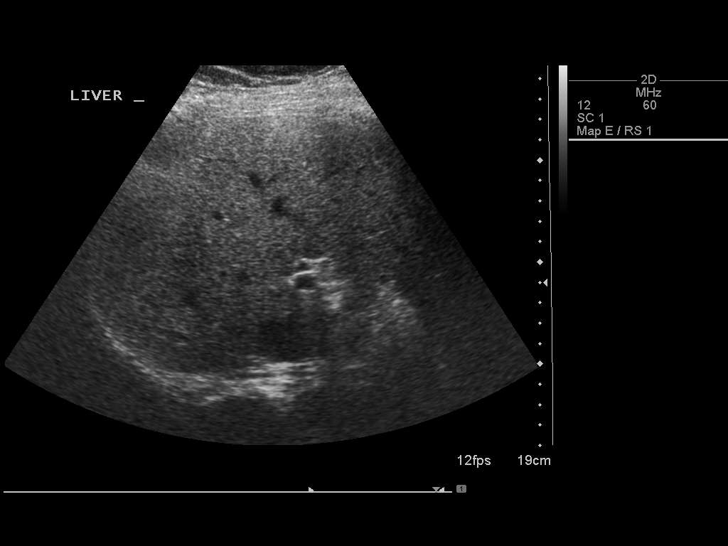
[im 22/36]
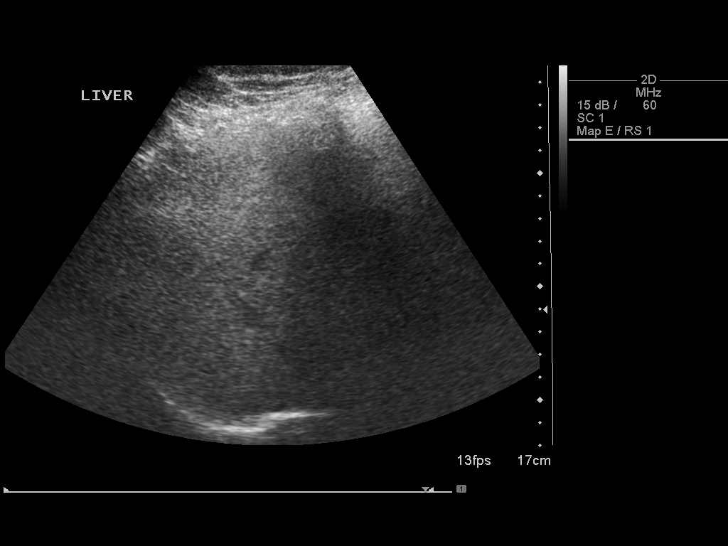
[im 24/36]
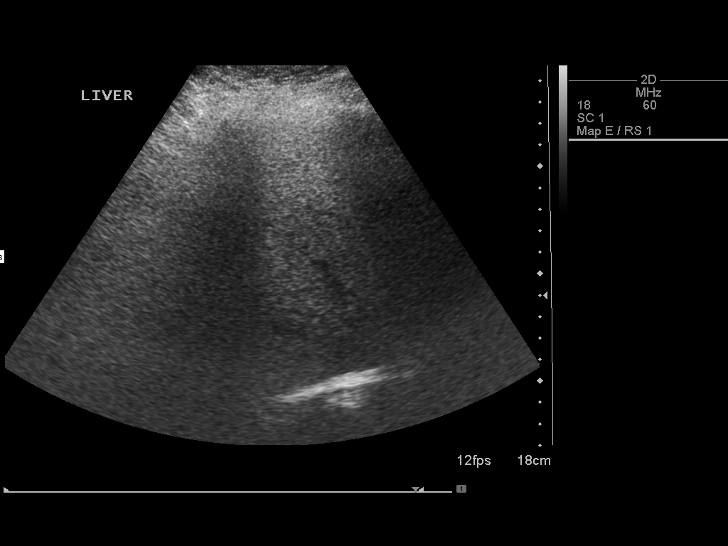
[im 27/36]
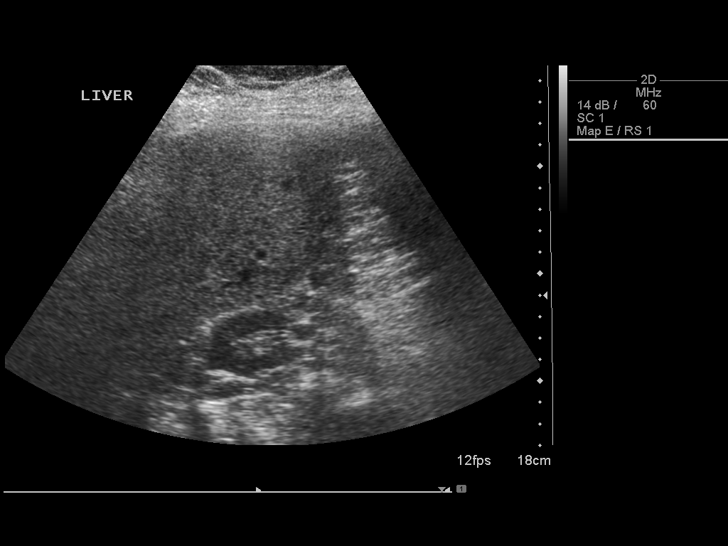
[im 30/36]
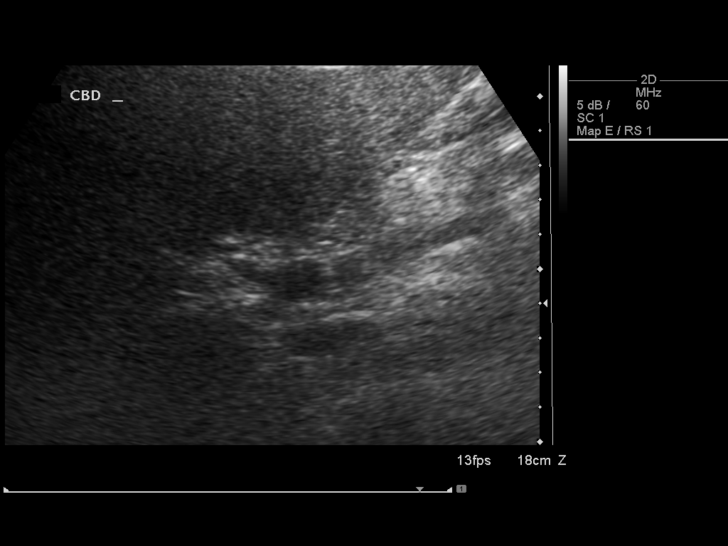
[im 33/36]
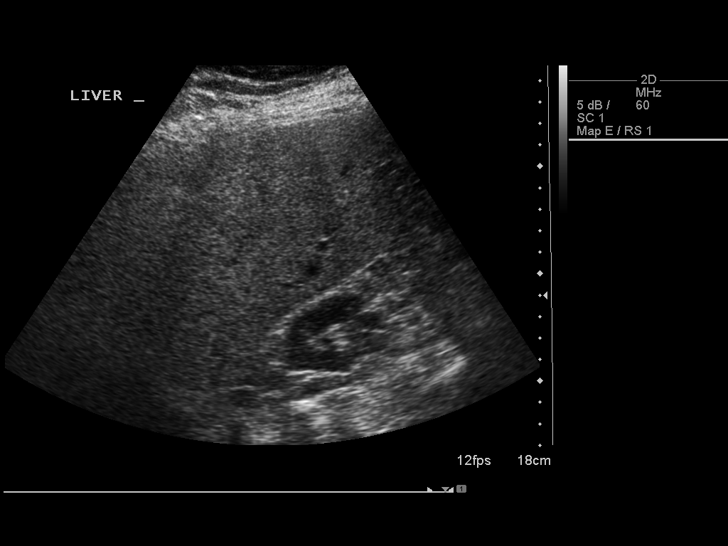
[im 36/36]
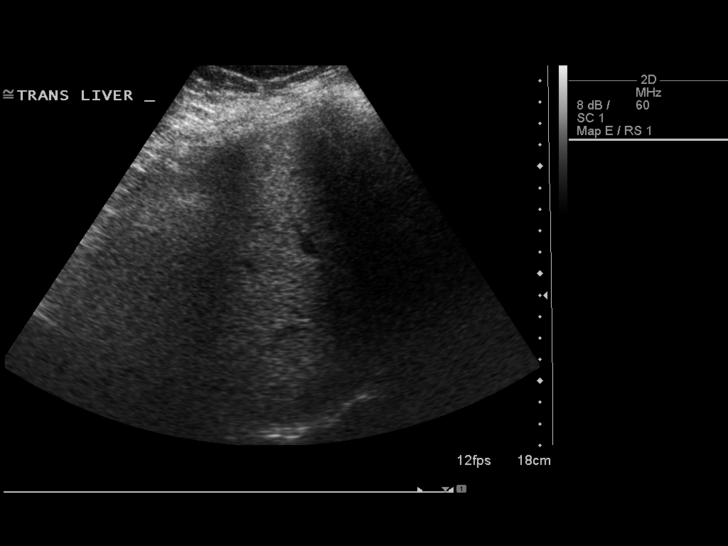

[14 of 25 positions shown; findings below may reference images not displayed]

FINDINGS: Gallbladder:  The gallbladder has been previously resected.

Common bile duct:  The common bile duct measures 3.3 mm in
diameter.

Liver:    The liver is diffusely echogenic consistent with fatty
infiltration.  There is a small hypoechoic structure within the
left lobe of liver as noted previously.  This is not a simple cyst
and may represent either a complex cyst or possibly a hemangioma
measuring 1.0 x 0.6 x 0.9 cm. Previously this hypoechoic structure
measuring 1.0 x 0.7 x 0.5 cm.
IMPRESSION: 1.  Stable hypoechoic lesion within the left lobe of liver most
consistent with complex cyst or hemangioma in the setting of
diffuse fatty infiltration.
2.  Diffuse fatty infiltration of liver.
3.  Prior cholecystectomy.

## 2019-05-13 ENCOUNTER — Ambulatory Visit: Payer: BC Managed Care – PPO | Attending: Internal Medicine

## 2019-05-13 DIAGNOSIS — Z23 Encounter for immunization: Secondary | ICD-10-CM

## 2019-05-13 NOTE — Progress Notes (Signed)
   Covid-19 Vaccination Clinic  Name:  Pamela Waters    MRN: [id number] DOB: [birth year]  05/13/2019  Pamela Waters was observed post Covid-19 immunization for 15 minutes without incidence. She was provided with Vaccine Information Sheet and instruction to access the V-Safe system.   Pamela Waters was instructed to call 911 with any severe reactions post vaccine: Marland Kitchen Difficulty breathing  . Swelling of your face and throat  . A fast heartbeat  . A bad rash all over your body  . Dizziness and weakness    Immunizations Administered    Name Date Dose VIS Date Route   Pfizer COVID-19 Vaccine 05/13/2019  3:23 PM 0.3 mL 02/24/2019 Intramuscular   Manufacturer: [place]   Lot: [id number]   [place]: [id number]

## 2019-06-03 ENCOUNTER — Ambulatory Visit: Payer: BC Managed Care – PPO | Attending: Internal Medicine

## 2019-06-03 DIAGNOSIS — Z23 Encounter for immunization: Secondary | ICD-10-CM

## 2019-06-03 NOTE — Progress Notes (Signed)
   Covid-19 Vaccination Clinic  Name:  Pamela Waters    MRN: [id number] DOB: 04-28-1955  06/03/2019  [name] was observed post Covid-19 immunization for 15 minutes without incident. She was provided with Vaccine Information Sheet and instruction to access the V-Safe system.   [name] was instructed to call 911 with any severe reactions post vaccine: Marland Kitchen Difficulty breathing  . Swelling of face and throat  . A fast heartbeat  . A bad rash all over body  . Dizziness and weakness   Immunizations Administered    Name Date Dose VIS Date Route   Pfizer COVID-19 Vaccine 06/03/2019  9:19 AM 0.3 mL 02/24/2019 Intramuscular   Manufacturer: [place]   Lot: [id number]   [place]: [id number]

## 2019-08-07 DIAGNOSIS — E1165 Type 2 diabetes mellitus with hyperglycemia: Secondary | ICD-10-CM | POA: Diagnosis not present

## 2019-08-07 DIAGNOSIS — E7800 Pure hypercholesterolemia, unspecified: Secondary | ICD-10-CM | POA: Diagnosis not present

## 2019-08-07 DIAGNOSIS — I10 Essential (primary) hypertension: Secondary | ICD-10-CM | POA: Diagnosis not present

## 2019-10-23 DIAGNOSIS — E1165 Type 2 diabetes mellitus with hyperglycemia: Secondary | ICD-10-CM | POA: Diagnosis not present

## 2019-10-23 DIAGNOSIS — I10 Essential (primary) hypertension: Secondary | ICD-10-CM | POA: Diagnosis not present

## 2019-10-23 DIAGNOSIS — E7800 Pure hypercholesterolemia, unspecified: Secondary | ICD-10-CM | POA: Diagnosis not present

## 2020-05-15 DIAGNOSIS — M8589 Other specified disorders of bone density and structure, multiple sites: Secondary | ICD-10-CM | POA: Diagnosis not present

## 2020-05-15 DIAGNOSIS — E7800 Pure hypercholesterolemia, unspecified: Secondary | ICD-10-CM | POA: Diagnosis not present

## 2020-05-15 DIAGNOSIS — I10 Essential (primary) hypertension: Secondary | ICD-10-CM | POA: Diagnosis not present

## 2020-05-15 DIAGNOSIS — Z1211 Encounter for screening for malignant neoplasm of colon: Secondary | ICD-10-CM | POA: Diagnosis not present

## 2020-05-15 DIAGNOSIS — Z23 Encounter for immunization: Secondary | ICD-10-CM | POA: Diagnosis not present

## 2020-05-15 DIAGNOSIS — Z6839 Body mass index (BMI) 39.0-39.9, adult: Secondary | ICD-10-CM | POA: Diagnosis not present

## 2020-05-15 DIAGNOSIS — E1169 Type 2 diabetes mellitus with other specified complication: Secondary | ICD-10-CM | POA: Diagnosis not present

## 2020-05-16 ENCOUNTER — Other Ambulatory Visit: Payer: Self-pay | Admitting: Family Medicine

## 2020-05-16 DIAGNOSIS — Z1231 Encounter for screening mammogram for malignant neoplasm of breast: Secondary | ICD-10-CM

## 2020-05-16 DIAGNOSIS — M8580 Other specified disorders of bone density and structure, unspecified site: Secondary | ICD-10-CM

## 2020-05-22 DIAGNOSIS — E1165 Type 2 diabetes mellitus with hyperglycemia: Secondary | ICD-10-CM | POA: Diagnosis not present

## 2020-10-30 ENCOUNTER — Ambulatory Visit
Admission: RE | Admit: 2020-10-30 | Discharge: 2020-10-30 | Disposition: A | Discharge status: Home / Self Care | Payer: Medicare Other | Source: Ambulatory Visit | Attending: Family Medicine | Admitting: Family Medicine

## 2020-10-30 ENCOUNTER — Other Ambulatory Visit: Payer: Self-pay

## 2020-10-30 DIAGNOSIS — Z1231 Encounter for screening mammogram for malignant neoplasm of breast: Secondary | ICD-10-CM | POA: Diagnosis not present

## 2020-10-30 DIAGNOSIS — M8580 Other specified disorders of bone density and structure, unspecified site: Secondary | ICD-10-CM

## 2020-10-30 DIAGNOSIS — Z780 Asymptomatic menopausal state: Secondary | ICD-10-CM | POA: Diagnosis not present

## 2020-11-27 DIAGNOSIS — Z23 Encounter for immunization: Secondary | ICD-10-CM | POA: Diagnosis not present

## 2020-11-27 DIAGNOSIS — E1169 Type 2 diabetes mellitus with other specified complication: Secondary | ICD-10-CM | POA: Diagnosis not present

## 2020-11-27 DIAGNOSIS — I10 Essential (primary) hypertension: Secondary | ICD-10-CM | POA: Diagnosis not present

## 2020-11-27 DIAGNOSIS — E7800 Pure hypercholesterolemia, unspecified: Secondary | ICD-10-CM | POA: Diagnosis not present

## 2021-01-23 DIAGNOSIS — K635 Polyp of colon: Secondary | ICD-10-CM | POA: Diagnosis not present

## 2021-01-23 DIAGNOSIS — K5730 Diverticulosis of large intestine without perforation or abscess without bleeding: Secondary | ICD-10-CM | POA: Diagnosis not present

## 2021-01-23 DIAGNOSIS — Z1211 Encounter for screening for malignant neoplasm of colon: Secondary | ICD-10-CM | POA: Diagnosis not present

## 2021-01-28 DIAGNOSIS — K635 Polyp of colon: Secondary | ICD-10-CM | POA: Diagnosis not present

## 2021-06-10 DIAGNOSIS — I10 Essential (primary) hypertension: Secondary | ICD-10-CM | POA: Diagnosis not present

## 2021-06-10 DIAGNOSIS — E7800 Pure hypercholesterolemia, unspecified: Secondary | ICD-10-CM | POA: Diagnosis not present

## 2021-06-10 DIAGNOSIS — Z0000 Encounter for general adult medical examination without abnormal findings: Secondary | ICD-10-CM | POA: Diagnosis not present

## 2021-06-10 DIAGNOSIS — E1169 Type 2 diabetes mellitus with other specified complication: Secondary | ICD-10-CM | POA: Diagnosis not present

## 2021-11-18 DIAGNOSIS — Z6837 Body mass index (BMI) 37.0-37.9, adult: Secondary | ICD-10-CM | POA: Diagnosis not present

## 2021-11-18 DIAGNOSIS — E1169 Type 2 diabetes mellitus with other specified complication: Secondary | ICD-10-CM | POA: Diagnosis not present

## 2021-11-18 DIAGNOSIS — I10 Essential (primary) hypertension: Secondary | ICD-10-CM | POA: Diagnosis not present

## 2021-11-18 DIAGNOSIS — E7800 Pure hypercholesterolemia, unspecified: Secondary | ICD-10-CM | POA: Diagnosis not present

## 2022-02-24 DIAGNOSIS — E1165 Type 2 diabetes mellitus with hyperglycemia: Secondary | ICD-10-CM | POA: Diagnosis not present

## 2022-03-18 DIAGNOSIS — M25561 Pain in right knee: Secondary | ICD-10-CM | POA: Diagnosis not present

## 2022-05-20 DIAGNOSIS — I10 Essential (primary) hypertension: Secondary | ICD-10-CM | POA: Diagnosis not present

## 2022-05-20 DIAGNOSIS — E1165 Type 2 diabetes mellitus with hyperglycemia: Secondary | ICD-10-CM | POA: Diagnosis not present

## 2022-05-20 DIAGNOSIS — E7800 Pure hypercholesterolemia, unspecified: Secondary | ICD-10-CM | POA: Diagnosis not present

## 2022-05-20 DIAGNOSIS — Z6836 Body mass index (BMI) 36.0-36.9, adult: Secondary | ICD-10-CM | POA: Diagnosis not present

## 2022-07-28 ENCOUNTER — Other Ambulatory Visit: Payer: Self-pay | Admitting: Family Medicine

## 2022-07-28 DIAGNOSIS — Z0000 Encounter for general adult medical examination without abnormal findings: Secondary | ICD-10-CM

## 2022-07-28 DIAGNOSIS — Z1389 Encounter for screening for other disorder: Secondary | ICD-10-CM | POA: Diagnosis not present

## 2022-07-29 ENCOUNTER — Ambulatory Visit
Admission: RE | Admit: 2022-07-29 | Discharge: 2022-07-29 | Disposition: A | Discharge status: Home / Self Care | Payer: Medicare Other | Source: Ambulatory Visit | Attending: Family Medicine | Admitting: Family Medicine

## 2022-07-29 DIAGNOSIS — Z0000 Encounter for general adult medical examination without abnormal findings: Secondary | ICD-10-CM

## 2022-07-29 DIAGNOSIS — Z1231 Encounter for screening mammogram for malignant neoplasm of breast: Secondary | ICD-10-CM | POA: Diagnosis not present

## 2022-07-31 ENCOUNTER — Other Ambulatory Visit: Payer: Self-pay | Admitting: Family Medicine

## 2022-07-31 DIAGNOSIS — R928 Other abnormal and inconclusive findings on diagnostic imaging of breast: Secondary | ICD-10-CM

## 2022-08-11 ENCOUNTER — Other Ambulatory Visit: Payer: Medicare Other

## 2022-08-21 ENCOUNTER — Ambulatory Visit
Admission: RE | Admit: 2022-08-21 | Discharge: 2022-08-21 | Disposition: A | Discharge status: Home / Self Care | Payer: Medicare Other | Source: Ambulatory Visit | Attending: Family Medicine | Admitting: Family Medicine

## 2022-08-21 ENCOUNTER — Ambulatory Visit: Payer: Medicare Other

## 2022-08-21 DIAGNOSIS — R928 Other abnormal and inconclusive findings on diagnostic imaging of breast: Secondary | ICD-10-CM

## 2022-08-21 DIAGNOSIS — R922 Inconclusive mammogram: Secondary | ICD-10-CM | POA: Diagnosis not present

## 2022-08-21 DIAGNOSIS — R92321 Mammographic fibroglandular density, right breast: Secondary | ICD-10-CM | POA: Diagnosis not present

## 2022-11-24 DIAGNOSIS — Z6835 Body mass index (BMI) 35.0-35.9, adult: Secondary | ICD-10-CM | POA: Diagnosis not present

## 2022-11-24 DIAGNOSIS — Z23 Encounter for immunization: Secondary | ICD-10-CM | POA: Diagnosis not present

## 2022-11-24 DIAGNOSIS — E1165 Type 2 diabetes mellitus with hyperglycemia: Secondary | ICD-10-CM | POA: Diagnosis not present

## 2022-11-24 DIAGNOSIS — I10 Essential (primary) hypertension: Secondary | ICD-10-CM | POA: Diagnosis not present

## 2022-11-24 DIAGNOSIS — E7800 Pure hypercholesterolemia, unspecified: Secondary | ICD-10-CM | POA: Diagnosis not present

## 2023-12-08 DIAGNOSIS — E1165 Type 2 diabetes mellitus with hyperglycemia: Secondary | ICD-10-CM | POA: Diagnosis not present

## 2023-12-08 DIAGNOSIS — Z1231 Encounter for screening mammogram for malignant neoplasm of breast: Secondary | ICD-10-CM | POA: Diagnosis not present

## 2023-12-08 DIAGNOSIS — E7800 Pure hypercholesterolemia, unspecified: Secondary | ICD-10-CM | POA: Diagnosis not present

## 2023-12-08 DIAGNOSIS — Z6835 Body mass index (BMI) 35.0-35.9, adult: Secondary | ICD-10-CM | POA: Diagnosis not present

## 2023-12-08 DIAGNOSIS — I10 Essential (primary) hypertension: Secondary | ICD-10-CM | POA: Diagnosis not present
